# Patient Record
Sex: Male | Born: 1975 | Race: White | Hispanic: No | Marital: Married | Smoking: Current every day smoker
Health system: Southern US, Community
[De-identification: ages and names within clinical notes are randomized; demographics above are authoritative.]

## PROBLEM LIST (undated history)

## (undated) DIAGNOSIS — F909 Attention-deficit hyperactivity disorder, unspecified type: Secondary | ICD-10-CM

## (undated) DIAGNOSIS — G919 Hydrocephalus, unspecified: Secondary | ICD-10-CM

## (undated) HISTORY — PX: CSF SHUNT: SHX92

---

## 2017-10-30 ENCOUNTER — Emergency Department (INDEPENDENT_AMBULATORY_CARE_PROVIDER_SITE_OTHER): Payer: 59

## 2017-10-30 ENCOUNTER — Emergency Department (INDEPENDENT_AMBULATORY_CARE_PROVIDER_SITE_OTHER)
Admission: EM | Admit: 2017-10-30 | Discharge: 2017-10-30 | Disposition: A | Discharge status: Home / Self Care | Payer: Self-pay | Source: Home / Self Care | Attending: Family Medicine | Admitting: Family Medicine

## 2017-10-30 ENCOUNTER — Telehealth: Payer: Self-pay | Admitting: *Deleted

## 2017-10-30 ENCOUNTER — Encounter: Payer: Self-pay | Admitting: Emergency Medicine

## 2017-10-30 ENCOUNTER — Other Ambulatory Visit: Payer: Self-pay

## 2017-10-30 DIAGNOSIS — M542 Cervicalgia: Secondary | ICD-10-CM | POA: Diagnosis not present

## 2017-10-30 DIAGNOSIS — S161XXA Strain of muscle, fascia and tendon at neck level, initial encounter: Secondary | ICD-10-CM

## 2017-10-30 HISTORY — DX: Attention-deficit hyperactivity disorder, unspecified type: F90.9

## 2017-10-30 HISTORY — DX: Hydrocephalus, unspecified: G91.9

## 2017-10-30 NOTE — ED Triage Notes (Signed)
Patient reports soreness in back of neck that radiates to shoulder areas on both sides.

## 2017-10-30 NOTE — Discharge Instructions (Signed)
Apply ice pack for 20 to 30 minutes, 3 to 4 times daily  Continue until pain and swelling decrease.  May take Tylenol as needed for pain. °Begin neck range of motion and stretching exercises as tolerated. °
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
## 2017-10-30 NOTE — Telephone Encounter (Signed)
Spoke to pt's wife given xray results. He will come by the clinic to pick up neck spasm xray handout per dr Cathren Harshbeese.
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
## 2021-07-31 ENCOUNTER — Encounter (HOSPITAL_BASED_OUTPATIENT_CLINIC_OR_DEPARTMENT_OTHER): Payer: Self-pay | Admitting: *Deleted

## 2021-07-31 ENCOUNTER — Emergency Department (HOSPITAL_BASED_OUTPATIENT_CLINIC_OR_DEPARTMENT_OTHER): Payer: No Typology Code available for payment source

## 2021-07-31 ENCOUNTER — Other Ambulatory Visit: Payer: Self-pay

## 2021-07-31 ENCOUNTER — Observation Stay (HOSPITAL_BASED_OUTPATIENT_CLINIC_OR_DEPARTMENT_OTHER)
Admission: EM | Admit: 2021-07-31 | Discharge: 2021-08-02 | Disposition: A | Discharge status: Home / Self Care | Payer: No Typology Code available for payment source | Attending: General Surgery | Admitting: General Surgery

## 2021-07-31 DIAGNOSIS — R1011 Right upper quadrant pain: Secondary | ICD-10-CM | POA: Diagnosis present

## 2021-07-31 DIAGNOSIS — F17210 Nicotine dependence, cigarettes, uncomplicated: Secondary | ICD-10-CM | POA: Diagnosis not present

## 2021-07-31 DIAGNOSIS — K8010 Calculus of gallbladder with chronic cholecystitis without obstruction: Secondary | ICD-10-CM | POA: Diagnosis present

## 2021-07-31 DIAGNOSIS — K8012 Calculus of gallbladder with acute and chronic cholecystitis without obstruction: Principal | ICD-10-CM | POA: Insufficient documentation

## 2021-07-31 DIAGNOSIS — K8000 Calculus of gallbladder with acute cholecystitis without obstruction: Secondary | ICD-10-CM | POA: Diagnosis present

## 2021-07-31 DIAGNOSIS — K819 Cholecystitis, unspecified: Secondary | ICD-10-CM | POA: Diagnosis present

## 2021-07-31 LAB — COMPREHENSIVE METABOLIC PANEL
ALT: 19 U/L (ref 0–44)
AST: 19 U/L (ref 15–41)
Albumin: 3.9 g/dL (ref 3.5–5.0)
Alkaline Phosphatase: 102 U/L (ref 38–126)
Anion gap: 7 (ref 5–15)
BUN: 11 mg/dL (ref 6–20)
CO2: 27 mmol/L (ref 22–32)
Calcium: 9 mg/dL (ref 8.9–10.3)
Chloride: 104 mmol/L (ref 98–111)
Creatinine, Ser: 0.88 mg/dL (ref 0.61–1.24)
GFR, Estimated: >60 mL/min (ref >60)
Glucose, Bld: 100 mg/dL — ABNORMAL HIGH (ref 70–99)
Potassium: 3.8 mmol/L (ref 3.5–5.1)
Sodium: 138 mmol/L (ref 135–145)
Total Bilirubin: 0.4 mg/dL (ref 0.3–1.2)
Total Protein: 6.8 g/dL (ref 6.5–8.1)

## 2021-07-31 LAB — LIPASE, BLOOD: Lipase: 47 U/L (ref 11–51)

## 2021-07-31 LAB — URINALYSIS, ROUTINE W REFLEX MICROSCOPIC
Bilirubin Urine: NEGATIVE
Glucose, UA: NEGATIVE mg/dL
Hgb urine dipstick: NEGATIVE
Ketones, ur: NEGATIVE mg/dL
Leukocytes,Ua: NEGATIVE
Nitrite: NEGATIVE
Protein, ur: NEGATIVE mg/dL
Specific Gravity, Urine: 1.025 (ref 1.005–1.030)
pH: 7.5 (ref 5.0–8.0)

## 2021-07-31 LAB — CBC
HCT: 45.8 % (ref 39.0–52.0)
Hemoglobin: 15.5 g/dL (ref 13.0–17.0)
MCH: 32.3 pg (ref 26.0–34.0)
MCHC: 33.8 g/dL (ref 30.0–36.0)
MCV: 95.4 fL (ref 80.0–100.0)
Platelets: 188 10*3/uL (ref 150–400)
RBC: 4.8 MIL/uL (ref 4.22–5.81)
RDW: 13.4 % (ref 11.5–15.5)
WBC: 6.9 10*3/uL (ref 4.0–10.5)
nRBC: 0 % (ref 0.0–0.2)

## 2021-07-31 MED ORDER — IOHEXOL 300 MG/ML  SOLN
100.0000 mL | Freq: Once | INTRAMUSCULAR | Status: AC | PRN
  Administered 2021-07-31: 100 mL via INTRAVENOUS

## 2021-07-31 MED ORDER — HYDROMORPHONE HCL 1 MG/ML IJ SOLN
0.5000 mg | Freq: Once | INTRAMUSCULAR | Status: AC
  Administered 2021-07-31: 0.5 mg via INTRAVENOUS
  Filled 2021-07-31: qty 1

## 2021-07-31 MED ORDER — PIPERACILLIN-TAZOBACTAM 3.375 G IVPB 30 MIN
3.3750 g | Freq: Once | INTRAVENOUS | Status: AC
Start: 2021-08-01 — End: 2021-08-01
  Administered 2021-07-31: 3.375 g via INTRAVENOUS
  Filled 2021-07-31: qty 50

## 2021-07-31 MED ORDER — ONDANSETRON HCL 4 MG/2ML IJ SOLN
4.0000 mg | Freq: Once | INTRAMUSCULAR | Status: AC
  Administered 2021-07-31: 4 mg via INTRAVENOUS
  Filled 2021-07-31: qty 2

## 2021-07-31 MED ORDER — MORPHINE SULFATE (PF) 4 MG/ML IV SOLN
4.0000 mg | Freq: Once | INTRAVENOUS | Status: AC
  Administered 2021-07-31: 4 mg via INTRAVENOUS
  Filled 2021-07-31: qty 1

## 2021-07-31 NOTE — ED Provider Notes (Signed)
?[facility] EMERGENCY DEPARTMENT ?Provider Note ? ?CSN: [id number] ?Arrival date & time: 07/31/21 2039 ? ?Chief Complaint(s) ?Abdominal Pain ? ?HPI ?Mathew Olson is a 46 y.o. male with PMH hydrocephalus as a child status post VP shunt that has since been removed, ADHD on Adderall who presents emergency department for evaluation of right upper quadrant abdominal pain.  Patient states that pain has been slowly worsening over the last 3 days.  Initially postprandial but now persistent.  Denies nausea, vomiting, headache, fever or other systemic symptoms. ? ? ?Abdominal Pain ? ?Past Medical History ?Past Medical History:  ?Diagnosis Date  ? ADHD   ? Hydrocephalus (HCC)   ? ?There are no problems to display for this patient. ? ?Home Medication(s) ?Prior to Admission medications   ?Medication Sig Start Date End Date Taking? Authorizing Provider  ?amphetamine-dextroamphetamine (ADDERALL) 20 MG tablet Take 20 mg by mouth daily.    [provider]  ?                                                                                                                                  ?Past Surgical History ?Past Surgical History:  ?Procedure Laterality Date  ? CSF SHUNT    ? ?Family History ?No family history on file. ? ?Social History ?Social History  ? ?Tobacco Use  ? Smoking status: Every Day  ?  Types: Cigarettes  ? Smokeless tobacco: Never  ?Vaping Use  ? Vaping Use: Never used  ?Substance Use Topics  ? Alcohol use: Yes  ?  Comment: occasional  ? Drug use: Not Currently  ? ?Allergies ?Patient has no active allergies. ? ?Review of Systems ?Review of Systems  ?Gastrointestinal:  Positive for abdominal pain.  ? ?Physical Exam ?Vital Signs  ?I have reviewed the triage vital signs ?BP 128/85   Pulse 70   Temp 97.9 ?F (36.6 ?C)   Resp 19   Ht 6\' 3"  (1.905 m)   Wt 98.7 kg   SpO2 97%   BMI 27.20 kg/m?  ? ?Physical Exam ?Vitals and nursing note reviewed.  ?Constitutional:   ?   General: He is not in  acute distress. ?   Appearance: He is well-developed.  ?HENT:  ?   Head: Normocephalic and atraumatic.  ?Eyes:  ?   Conjunctiva/sclera: Conjunctivae normal.  ?Cardiovascular:  ?   Rate and Rhythm: Normal rate and regular rhythm.  ?   Heart sounds: No murmur heard. ?Pulmonary:  ?   Effort: Pulmonary effort is normal. No respiratory distress.  ?   Breath sounds: Normal breath sounds.  ?Abdominal:  ?   Palpations: Abdomen is soft.  ?   Tenderness: There is abdominal tenderness in the right upper quadrant.  ?Musculoskeletal:     ?   General: No swelling.  ?   Cervical back: Neck supple.  ?Skin: ?   General: Skin is warm and dry.  ?   Capillary Refill:  Capillary refill takes less than 2 seconds.  ?Neurological:  ?   Mental Status: He is alert.  ?Psychiatric:     ?   Mood and Affect: Mood normal.  ? ? ?ED Results and Treatments ?Labs ?(all labs ordered are listed, but only abnormal results are displayed) ?Labs Reviewed  ?COMPREHENSIVE METABOLIC PANEL - Abnormal; Notable for the following components:  ?    Result Value  ? Glucose, Bld 100 (*)   ? All other components within normal limits  ?URINALYSIS, ROUTINE W REFLEX MICROSCOPIC - Abnormal; Notable for the following components:  ? APPearance CLOUDY (*)   ? All other components within normal limits  ?LIPASE, BLOOD  ?CBC  ?                                                                                                                       ? ?Radiology ?No results found. ? ?Pertinent labs & imaging results that were available during my care of the patient were reviewed by me and considered in my medical decision making (see MDM for details). ? ?Medications Ordered in ED ?Medications  ?morphine (PF) 4 MG/ML injection 4 mg (4 mg Intravenous Given 07/31/21 2205)  ?ondansetron [facility](ZOFRAN) injection 4 mg (4 mg Intravenous Given 07/31/21 2205)  ?iohexol (OMNIPAQUE) 300 MG/ML solution 100 mL (100 mLs Intravenous Contrast Given 07/31/21 2238)  ?                                                                ?                                                                    ?Procedures ?Procedures ? ?(including critical care time) ? ?Medical Decision Making / ED Course ? ? ?This patient presents to the ED for concern of right upper quadrant abdominal pain, this involves an extensive number of treatment options, and is a complaint that carries with it a high risk of complications and morbidity.  The differential diagnosis includes cholecystitis, choledocholithiasis, biliary colic pancreatitis ? ?MDM: ?Patient seen in the emergency department for evaluation of abdominal pain.  Physical exam with significant tenderness with positive Murphy sign in the right upper quadrant.  Laboratory evaluation unremarkable.  CT abdomen pelvis concerning for chronic cholecystitis versus developing porcelain gallbladder.  Surgery [facility]  Zosyn started and patient admitted. ? ? ?Additional history obtained: ? ?-External records from outside source obtained and reviewed including: Chart review including previous notes, labs, imaging, consultation notes ? ? ?  Lab Tests: ?-I ordered, reviewed, and interpreted labs.   ?The pertinent results include:   ?Labs Reviewed  ?COMPREHENSIVE METABOLIC PANEL - Abnormal; Notable for the following components:  ?    Result Value  ? Glucose, Bld 100 (*)   ? All other components within normal limits  ?URINALYSIS, ROUTINE W REFLEX MICROSCOPIC - Abnormal; Notable for the following components:  ? APPearance CLOUDY (*)   ? All other components within normal limits  ?LIPASE, BLOOD  ?CBC  ?  ? ? ? ? ? ?Imaging Studies ordered: ?I ordered imaging studies including CTAP ?I independently visualized and interpreted imaging. ?I agree with the radiologist interpretation ? ? ?Medicines ordered and prescription drug management: ?Meds ordered this encounter  ?Medications  ? morphine (PF) 4 MG/ML injection 4 mg  ? ondansetron (ZOFRAN) injection 4 mg  ? iohexol  (OMNIPAQUE) 300 MG/ML solution 100 mL  ?  ?-I have reviewed the patients home medicines and have made adjustments as needed ? ?Critical interventions ?none ? ?Consultations Obtained: ?I requested consultation with the general surgery team,  and discussed lab and imaging findings as well as pertinent plan - they recommend: Admission to [facility] ? ? ?Cardiac Monitoring: ?The patient was maintained on a cardiac monitor.  I personally viewed and interpreted the cardiac monitored which showed an underlying rhythm of: NSR ? ?Social Determinants of Health:  ?Factors impacting patients care include: none ? ? ?Reevaluation: ?After the interventions noted above, I reevaluated the patient and found that they have :improved ? ?Co morbidities that complicate the patient evaluation ? ?Past Medical History:  ?Diagnosis Date  ? ADHD   ? Hydrocephalus (HCC)   ?  ? ? ?Dispostion: ?I considered admission for this patient, and due to his symptomatic chronic cholecystitis patient will be admitted ? ? ? ? ?Final Clinical Impression(s) / ED Diagnoses ?Final diagnoses:  ?None  ? ? ? ?@PCDICTATION @ ? ?  ? , MD ?07/31/21 2337 ? ?

## 2021-07-31 NOTE — ED Triage Notes (Signed)
Pt reports RUQ pain x 2 days. Denies n/v ?

## 2021-08-01 ENCOUNTER — Observation Stay (HOSPITAL_BASED_OUTPATIENT_CLINIC_OR_DEPARTMENT_OTHER): Payer: No Typology Code available for payment source | Admitting: Certified Registered Nurse Anesthetist

## 2021-08-01 ENCOUNTER — Encounter (HOSPITAL_COMMUNITY): Payer: Self-pay

## 2021-08-01 ENCOUNTER — Encounter (HOSPITAL_COMMUNITY): Admission: EM | Disposition: A | Discharge status: Home / Self Care | Payer: Self-pay | Source: Home / Self Care

## 2021-08-01 ENCOUNTER — Observation Stay (HOSPITAL_COMMUNITY): Payer: No Typology Code available for payment source | Admitting: Certified Registered Nurse Anesthetist

## 2021-08-01 DIAGNOSIS — K8010 Calculus of gallbladder with chronic cholecystitis without obstruction: Secondary | ICD-10-CM

## 2021-08-01 DIAGNOSIS — K8000 Calculus of gallbladder with acute cholecystitis without obstruction: Secondary | ICD-10-CM | POA: Diagnosis present

## 2021-08-01 HISTORY — PX: CHOLECYSTECTOMY: SHX55

## 2021-08-01 LAB — HIV ANTIBODY (ROUTINE TESTING W REFLEX): HIV Screen 4th Generation wRfx: NONREACTIVE

## 2021-08-01 SURGERY — LAPAROSCOPIC CHOLECYSTECTOMY
Anesthesia: General | Site: Abdomen

## 2021-08-01 MED ORDER — ONDANSETRON HCL 4 MG/2ML IJ SOLN
INTRAMUSCULAR | Status: DC | PRN
  Administered 2021-08-01: 4 mg via INTRAVENOUS

## 2021-08-01 MED ORDER — OXYCODONE HCL 5 MG PO TABS: 5.0000 mg | ORAL_TABLET | Freq: Once | ORAL | Status: DC | PRN

## 2021-08-01 MED ORDER — OXYCODONE HCL 5 MG/5ML PO SOLN: 5.0000 mg | Freq: Once | ORAL | Status: DC | PRN

## 2021-08-01 MED ORDER — HYDROMORPHONE HCL 1 MG/ML IJ SOLN: 1.0000 mg | INTRAMUSCULAR | Status: DC | PRN

## 2021-08-01 MED ORDER — ACETAMINOPHEN 10 MG/ML IV SOLN
1000.0000 mg | Freq: Once | INTRAVENOUS | Status: DC | PRN
  Administered 2021-08-01: 1000 mg via INTRAVENOUS

## 2021-08-01 MED ORDER — FENTANYL CITRATE PF 50 MCG/ML IJ SOSY
PREFILLED_SYRINGE | INTRAMUSCULAR | Status: AC
  Filled 2021-08-01: qty 3

## 2021-08-01 MED ORDER — FENTANYL CITRATE (PF) 250 MCG/5ML IJ SOLN
INTRAMUSCULAR | Status: DC | PRN
  Administered 2021-08-01 (×2): 100 ug via INTRAVENOUS
  Administered 2021-08-01: 50 ug via INTRAVENOUS

## 2021-08-01 MED ORDER — DEXAMETHASONE SODIUM PHOSPHATE 10 MG/ML IJ SOLN
INTRAMUSCULAR | Status: AC
  Filled 2021-08-01: qty 1

## 2021-08-01 MED ORDER — LIDOCAINE HCL (CARDIAC) PF 100 MG/5ML IV SOSY
PREFILLED_SYRINGE | INTRAVENOUS | Status: DC | PRN
  Administered 2021-08-01: 60 mg via INTRAVENOUS

## 2021-08-01 MED ORDER — TRAMADOL HCL 50 MG PO TABS: 50.0000 mg | ORAL_TABLET | Freq: Four times a day (QID) | ORAL | Status: DC | PRN

## 2021-08-01 MED ORDER — KETOROLAC TROMETHAMINE 30 MG/ML IJ SOLN
INTRAMUSCULAR | Status: AC
  Filled 2021-08-01: qty 1

## 2021-08-01 MED ORDER — ONDANSETRON HCL 4 MG/2ML IJ SOLN
INTRAMUSCULAR | Status: AC
  Filled 2021-08-01: qty 2

## 2021-08-01 MED ORDER — ROCURONIUM BROMIDE 10 MG/ML (PF) SYRINGE
PREFILLED_SYRINGE | INTRAVENOUS | Status: DC | PRN
  Administered 2021-08-01: 60 mg via INTRAVENOUS

## 2021-08-01 MED ORDER — ONDANSETRON HCL 4 MG/2ML IJ SOLN: 4.0000 mg | Freq: Four times a day (QID) | INTRAMUSCULAR | Status: DC | PRN

## 2021-08-01 MED ORDER — LIDOCAINE HCL (PF) 2 % IJ SOLN
INTRAMUSCULAR | Status: AC
  Filled 2021-08-01: qty 5

## 2021-08-01 MED ORDER — ACETAMINOPHEN 10 MG/ML IV SOLN
INTRAVENOUS | Status: AC
  Filled 2021-08-01: qty 100

## 2021-08-01 MED ORDER — CHLORHEXIDINE GLUCONATE CLOTH 2 % EX PADS: 6.0000 | MEDICATED_PAD | Freq: Once | CUTANEOUS | Status: DC

## 2021-08-01 MED ORDER — LACTATED RINGERS IR SOLN
Status: DC | PRN
  Administered 2021-08-01: 1000 mL

## 2021-08-01 MED ORDER — CEFAZOLIN SODIUM-DEXTROSE 2-4 GM/100ML-% IV SOLN
INTRAVENOUS | Status: AC
  Filled 2021-08-01: qty 100

## 2021-08-01 MED ORDER — HYDROMORPHONE HCL 1 MG/ML IJ SOLN
1.0000 mg | INTRAMUSCULAR | Status: DC | PRN
  Administered 2021-08-01 (×2): 1 mg via INTRAVENOUS
  Filled 2021-08-01 (×2): qty 1

## 2021-08-01 MED ORDER — ONDANSETRON 4 MG PO TBDP: 4.0000 mg | ORAL_TABLET | Freq: Four times a day (QID) | ORAL | Status: DC | PRN

## 2021-08-01 MED ORDER — ROCURONIUM BROMIDE 10 MG/ML (PF) SYRINGE
PREFILLED_SYRINGE | INTRAVENOUS | Status: AC
  Filled 2021-08-01: qty 10

## 2021-08-01 MED ORDER — DEXAMETHASONE SODIUM PHOSPHATE 10 MG/ML IJ SOLN
INTRAMUSCULAR | Status: DC | PRN
  Administered 2021-08-01: 10 mg via INTRAVENOUS

## 2021-08-01 MED ORDER — MIDAZOLAM HCL 2 MG/2ML IJ SOLN
INTRAMUSCULAR | Status: DC | PRN
  Administered 2021-08-01: 2 mg via INTRAVENOUS

## 2021-08-01 MED ORDER — FENTANYL CITRATE (PF) 250 MCG/5ML IJ SOLN
INTRAMUSCULAR | Status: AC
Start: 2021-08-01 — End: ?
  Filled 2021-08-01: qty 5

## 2021-08-01 MED ORDER — MIDAZOLAM HCL 2 MG/2ML IJ SOLN
INTRAMUSCULAR | Status: AC
  Filled 2021-08-01: qty 2

## 2021-08-01 MED ORDER — LACTATED RINGERS IV SOLN: INTRAVENOUS | Status: DC | PRN

## 2021-08-01 MED ORDER — ACETAMINOPHEN 650 MG RE SUPP: 650.0000 mg | Freq: Four times a day (QID) | RECTAL | Status: DC | PRN

## 2021-08-01 MED ORDER — ACETAMINOPHEN 325 MG PO TABS
650.0000 mg | ORAL_TABLET | Freq: Four times a day (QID) | ORAL | Status: DC | PRN
Start: 2021-08-01 — End: 2021-08-02

## 2021-08-01 MED ORDER — BUPIVACAINE-EPINEPHRINE 0.5% -1:200000 IJ SOLN
INTRAMUSCULAR | Status: DC | PRN
  Administered 2021-08-01: 30 mL

## 2021-08-01 MED ORDER — 0.9 % SODIUM CHLORIDE (POUR BTL) OPTIME
TOPICAL | Status: DC | PRN
  Administered 2021-08-01: 1000 mL

## 2021-08-01 MED ORDER — SODIUM CHLORIDE 0.45 % IV SOLN: INTRAVENOUS | Status: DC

## 2021-08-01 MED ORDER — PROPOFOL 10 MG/ML IV BOLUS
INTRAVENOUS | Status: DC | PRN
  Administered 2021-08-01: 200 mg via INTRAVENOUS

## 2021-08-01 MED ORDER — SUGAMMADEX SODIUM 200 MG/2ML IV SOLN
INTRAVENOUS | Status: DC | PRN
  Administered 2021-08-01: 200 mg via INTRAVENOUS

## 2021-08-01 MED ORDER — CHLORHEXIDINE GLUCONATE CLOTH 2 % EX PADS
6.0000 | MEDICATED_PAD | Freq: Once | CUTANEOUS | Status: DC
  Administered 2021-08-01: 6 via TOPICAL

## 2021-08-01 MED ORDER — AMISULPRIDE (ANTIEMETIC) 5 MG/2ML IV SOLN: 10.0000 mg | Freq: Once | INTRAVENOUS | Status: DC | PRN

## 2021-08-01 MED ORDER — KCL IN DEXTROSE-NACL 20-5-0.45 MEQ/L-%-% IV SOLN
INTRAVENOUS | Status: DC
  Filled 2021-08-01: qty 1000

## 2021-08-01 MED ORDER — CEFAZOLIN SODIUM-DEXTROSE 2-4 GM/100ML-% IV SOLN
2.0000 g | INTRAVENOUS | Status: AC
  Administered 2021-08-01: 2 g via INTRAVENOUS

## 2021-08-01 MED ORDER — PROPOFOL 10 MG/ML IV BOLUS
INTRAVENOUS | Status: AC
  Filled 2021-08-01: qty 20

## 2021-08-01 MED ORDER — KETOROLAC TROMETHAMINE 30 MG/ML IJ SOLN
30.0000 mg | Freq: Once | INTRAMUSCULAR | Status: AC
  Administered 2021-08-01: 30 mg via INTRAVENOUS

## 2021-08-01 MED ORDER — OXYCODONE HCL 5 MG PO TABS: 5.0000 mg | ORAL_TABLET | ORAL | Status: DC | PRN

## 2021-08-01 MED ORDER — BUPIVACAINE-EPINEPHRINE (PF) 0.25% -1:200000 IJ SOLN
INTRAMUSCULAR | Status: AC
  Filled 2021-08-01: qty 30

## 2021-08-01 MED ORDER — ACETAMINOPHEN 325 MG PO TABS: 650.0000 mg | ORAL_TABLET | Freq: Four times a day (QID) | ORAL | Status: DC | PRN

## 2021-08-01 MED ORDER — FENTANYL CITRATE PF 50 MCG/ML IJ SOSY
25.0000 ug | PREFILLED_SYRINGE | INTRAMUSCULAR | Status: DC | PRN
  Administered 2021-08-01 (×2): 50 ug via INTRAVENOUS

## 2021-08-01 SURGICAL SUPPLY — 38 items
APPLIER CLIP ROT 10 11.4 M/L (STAPLE) ×2
BAG COUNTER SPONGE SURGICOUNT (BAG) IMPLANT
CABLE HIGH FREQUENCY MONO STRZ (ELECTRODE) ×2 IMPLANT
CHLORAPREP W/TINT 26 (MISCELLANEOUS) ×2 IMPLANT
CLIP APPLIE ROT 10 11.4 M/L (STAPLE) ×1 IMPLANT
COVER MAYO STAND STRL (DRAPES) IMPLANT
COVER SURGICAL LIGHT HANDLE (MISCELLANEOUS) ×2 IMPLANT
DERMABOND ADVANCED (GAUZE/BANDAGES/DRESSINGS) ×1
DERMABOND ADVANCED .7 DNX12 (GAUZE/BANDAGES/DRESSINGS) IMPLANT
DRAPE C-ARM 42X120 X-RAY (DRAPES) IMPLANT
ELECT REM PT RETURN 15FT ADLT (MISCELLANEOUS) ×2 IMPLANT
GLOVE SURG ORTHO LTX SZ8 (GLOVE) ×2 IMPLANT
GLOVE SURG SYN 7.5  E (GLOVE) ×1
GLOVE SURG SYN 7.5 E (GLOVE) ×1 IMPLANT
GLOVE SURG SYN 7.5 PF PI (GLOVE) ×1 IMPLANT
GOWN STRL REUS W/ TWL XL LVL3 (GOWN DISPOSABLE) ×2 IMPLANT
GOWN STRL REUS W/TWL XL LVL3 (GOWN DISPOSABLE) ×2
HEMOSTAT SURGICEL 4X8 (HEMOSTASIS) IMPLANT
IRRIG SUCT STRYKERFLOW 2 WTIP (MISCELLANEOUS) ×2
IRRIGATION SUCT STRKRFLW 2 WTP (MISCELLANEOUS) ×1 IMPLANT
KIT BASIN OR (CUSTOM PROCEDURE TRAY) ×2 IMPLANT
KIT TURNOVER KIT A (KITS) IMPLANT
PAD POSITIONING PINK XL (MISCELLANEOUS) IMPLANT
PENCIL SMOKE EVACUATOR (MISCELLANEOUS) IMPLANT
POUCH SPECIMEN RETRIEVAL 10MM (ENDOMECHANICALS) ×2 IMPLANT
SCISSORS LAP 5X35 DISP (ENDOMECHANICALS) ×2 IMPLANT
SET CHOLANGIOGRAPH MIX (MISCELLANEOUS) ×2 IMPLANT
SET TUBE SMOKE EVAC HIGH FLOW (TUBING) ×2 IMPLANT
SLEEVE XCEL OPT CAN 5 100 (ENDOMECHANICALS) ×2 IMPLANT
SPIKE FLUID TRANSFER (MISCELLANEOUS) ×2 IMPLANT
STRIP CLOSURE SKIN 1/2X4 (GAUZE/BANDAGES/DRESSINGS) ×2 IMPLANT
SUT VIC AB 4-0 PS2 27 (SUTURE) ×2 IMPLANT
TAPE CLOTH 4X10 WHT NS (GAUZE/BANDAGES/DRESSINGS) IMPLANT
TOWEL OR 17X26 10 PK STRL BLUE (TOWEL DISPOSABLE) ×2 IMPLANT
TRAY LAPAROSCOPIC (CUSTOM PROCEDURE TRAY) ×2 IMPLANT
TROCAR BLADELESS OPT 5 100 (ENDOMECHANICALS) ×2 IMPLANT
TROCAR XCEL BLUNT TIP 100MML (ENDOMECHANICALS) ×2 IMPLANT
TROCAR XCEL NON-BLD 11X100MML (ENDOMECHANICALS) ×2 IMPLANT

## 2021-08-01 NOTE — Op Note (Signed)
Procedure Note ? ?Pre-operative Diagnosis:  cholecystitis, cholelithiasis ? ?Post-operative Diagnosis:  same ? ?Surgeon:  Darnell Level, MD ? ?Assistant:  none  ? ?Procedure:  Laparoscopic cholecystectomy ? ?Anesthesia:  General ? ?Estimated Blood Loss:  minimal ? ?Drains: none ?        ?Specimen: gallbladder to pathology ? ?Indications:  Patient is a 46 year old male who presented to the emergency department at [facility] with 3-day history of abdominal pain localized to the right upper quadrant and epigastrium.  This was associated with nausea but no emesis.  Laboratory studies were normal.  Patient underwent CT scan of the abdomen and pelvis.  This demonstrated cholelithiasis with diffuse gallbladder wall thickening and scattered mural calcification.  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  Patient was transferred to [facility] for surgical management. ? ?Procedure description: The patient was seen in the pre-op holding area. The risks, benefits, complications, treatment options, and expected outcomes were previously discussed with the patient. The patient agreed with the proposed plan and has signed the informed consent form. ? ?The patient was transported to operating room #1 at the [facility]. The patient was placed in the supine position on the operating room table. Following induction of general anesthesia, the abdomen was prepped and draped in the usual aseptic fashion. ? ?An incision was made in the skin near the umbilicus. The midline fascia was incised and the peritoneal cavity was entered and a Hasson cannula was introduced under direct vision. The cannula was secured with a 0-Vicryl pursestring suture. Pneumoperitoneum was established with carbon dioxide. Additional cannulae were introduced under direct vision along the right costal margin in the midline, mid-clavicular line, and anterior axillary line. ?  ?The gallbladder was identified and the fundus  grasped and retracted cephalad. Adhesions were taken down bluntly and the electrocautery was utilized as needed, taking care not to involve any adjacent structures. The infundibulum was grasped and retracted laterally, exposing the peritoneum overlying the triangle of Calot. The common bile duct was visualized and appeared normal.  The peritoneum in the triangle was incised and structures exposed with blunt dissection. The cystic duct was clearly identified, bluntly dissected circumferentially, and clipped at the neck of the gallbladder.  The cystic duct was then ligated distally with ligaclips and divided. The cystic artery was identified, dissected circumferentially, ligated with ligaclips, and divided. ? ?The gallbladder was dissected away from the gallbladder bed using the electrocautery for hemostasis. The gallbladder was completely removed from the liver and placed into an endocatch bag. The gallbladder was removed in the endocatch bag through the umbilical port site and submitted to pathology for review. ? ?The right upper quadrant was irrigated and the gallbladder bed was inspected. Hemostasis was achieved with the electrocautery. ? ?Cannulae were removed under direct vision and good hemostasis was noted. Pneumoperitoneum was released and the majority of the carbon dioxide evacuated. The umbilical wound was irrigated and the fascia was then closed with the pursestring suture.  Local anesthetic was infiltrated at all port sites. Skin incisions were closed with 4-0 Monocril subcuticular sutures and Dermabond was applied. ? ?Instrument, sponge, and needle counts were correct at the conclusion of the case.  The patient was awakened from anesthesia and brought to the recovery room in stable condition.  The patient tolerated the procedure well. ? ? ?Darnell Level, MD ?[facility] ?Office: [phone] ?  ?

## 2021-08-01 NOTE — Anesthesia Procedure Notes (Signed)
Procedure Name: Intubation ?Date/Time: 08/01/2021 9:05 AM ?Performed by: Raenette Rover, CRNA ?Pre-anesthesia Checklist: Patient identified, Emergency Drugs available, Suction available and Patient being monitored ?Patient Re-evaluated:Patient Re-evaluated prior to induction ?Oxygen Delivery Method: Circle system utilized ?Preoxygenation: Pre-oxygenation with 100% oxygen ?Induction Type: IV induction ?Ventilation: Mask ventilation without difficulty ?Laryngoscope Size: Mac and 4 ?Grade View: Grade II ?Tube type: Oral ?Tube size: 7.5 mm ?Number of attempts: 1 ?Airway Equipment and Method: Stylet ?Placement Confirmation: ETT inserted through vocal cords under direct vision, positive ETCO2 and breath sounds checked- equal and bilateral ?Secured at: 24 cm ?Tube secured with: Tape ?Dental Injury: Teeth and Oropharynx as per pre-operative assessment  ? ? ? ? ?

## 2021-08-01 NOTE — ED Notes (Signed)
Patient is waiting for his wife to arrive, to transport him to [facility] ED.  ?

## 2021-08-01 NOTE — Anesthesia Preprocedure Evaluation (Addendum)
Anesthesia Evaluation  ?Patient identified by MRN, date of birth, ID band ?Patient awake ? ? ? ?Reviewed: ?Allergy & Precautions, NPO status , Patient's Chart, lab work & pertinent test results ? ?Airway ?Mallampati: II ? ?TM Distance: >3 FB ?Neck ROM: Full ? ? ? Dental ? ?(+) Poor Dentition ?  ?Pulmonary ?Current Smoker and Patient abstained from smoking.,  ?  ?Pulmonary exam normal ? ? ? ? ? ? ? Cardiovascular ?negative cardio ROS ?Normal cardiovascular exam ? ? ?  ?Neuro/Psych ?PSYCHIATRIC DISORDERS negative neurological ROS ?   ? GI/Hepatic ?negative GI ROS, Neg liver ROS,   ?Endo/Other  ?negative endocrine ROS ? Renal/GU ?negative Renal ROS  ? ?  ?Musculoskeletal ?negative musculoskeletal ROS ?(+)  ? Abdominal ?  ?Peds ? ?(+) ADHD Hematology ?negative hematology ROS ?(+)   ?Anesthesia Other Findings ?ACUTE CHOLECYSTITIS ?CHOLELITHIASIS ? Reproductive/Obstetrics ? ?  ? ? ? ? ? ? ? ? ? ? ? ? ? ?  ?  ? ? ? ? ? ? ? ?Anesthesia Physical ?Anesthesia Plan ? ?ASA: 2 ? ?Anesthesia Plan: General  ? ?Post-op Pain Management:   ? ?Induction: Intravenous ? ?PONV Risk Score and Plan: 2 and Ondansetron, Dexamethasone, Midazolam and Treatment may vary due to age or medical condition ? ?Airway Management Planned: Oral ETT ? ?Additional Equipment:  ? ?Intra-op Plan:  ? ?Post-operative Plan: Extubation in OR ? ?Informed Consent: I have reviewed the patients History and Physical, chart, labs and discussed the procedure including the risks, benefits and alternatives for the proposed anesthesia with the patient or authorized representative who has indicated his/her understanding and acceptance.  ? ? ? ?Dental advisory given ? ?Plan Discussed with: CRNA ? ?Anesthesia Plan Comments:   ? ? ? ? ? ?Anesthesia Quick Evaluation ? ?

## 2021-08-01 NOTE — Progress Notes (Signed)
MD Gerkin paged for patient's arrival. ?

## 2021-08-01 NOTE — ED Notes (Signed)
Wife is here to pick up the patient. She was instructed to take him to [facility]. Message sent to nurse to advise of transportation. Nurse will meet the patient in the ED.  ?

## 2021-08-01 NOTE — H&P (Signed)
? ? ? ?Mathew NorfolkChristopher Olson is an 46 y.o. male.   ? ?Chief Complaint:  abdominal pain, acute cholecystitis, cholelithiasis ? ?HPI: Patient is a 46 year old male who presented to the emergency department at [facility] with 3-day history of abdominal pain localized to the right upper quadrant and epigastrium.  This was associated with nausea but no emesis.  Laboratory studies were normal.  Patient underwent CT scan of the abdomen and pelvis.  This demonstrated cholelithiasis with diffuse gallbladder wall thickening and scattered mural calcification.  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  [facility] management. ? ?Patient has a history of ventriculoperitoneal shunt as an infant.  This was apparently in place for hydrocephalus for only a few months.  Patient has had no other abdominal surgery. ? ?Patient denies any history of hepatobiliary or pancreatic disease.  He denies jaundice or acholic stools.  There is no history of hepatitis or pancreatitis. ? ?Patient is employed as a [employer] architect[employer]ruck driver. ? ?Past Medical History:  ?Diagnosis Date  ? ADHD   ? Hydrocephalus (HCC)   ? ? ?Past Surgical History:  ?Procedure Laterality Date  ? CSF SHUNT    ? ? ?History reviewed. No pertinent family history. ?Social History:  reports that he has been smoking cigarettes. He has never used smokeless tobacco. He reports current alcohol use. He reports that he does not currently use drugs. ? ?Allergies: No Known Allergies ? ?Medications Prior to Admission  ?Medication Sig Dispense Refill  ? amphetamine-dextroamphetamine (ADDERALL) 20 MG tablet Take 20 mg by mouth daily.    ? ? ?Results for orders placed or performed during the hospital encounter of 07/31/21 (from the past 48 hour(s))  ?Urinalysis, Routine w reflex microscopic Urine, Clean Catch     Status: Abnormal  ? Collection Time: 07/31/21  9:03 PM  ?Result Value Ref Range  ? Color, Urine YELLOW YELLOW  ?  APPearance CLOUDY (A) CLEAR  ? Specific Gravity, Urine 1.025 1.005 - 1.030  ? pH 7.5 5.0 - 8.0  ? Glucose, UA NEGATIVE NEGATIVE mg/dL  ? Hgb urine dipstick NEGATIVE NEGATIVE  ? Bilirubin Urine NEGATIVE NEGATIVE  ? Ketones, ur NEGATIVE NEGATIVE mg/dL  ? Protein, ur NEGATIVE NEGATIVE mg/dL  ? Nitrite NEGATIVE NEGATIVE  ? Leukocytes,Ua NEGATIVE NEGATIVE  ?  Comment: Microscopic not done on urines with negative protein, blood, leukocytes, nitrite, or glucose < 500 mg/dL. ?Performed at [facility] [place], [address] [place], [place] [address] ?  ?Lipase, blood     Status: None  ? Collection Time: 07/31/21  9:31 PM  ?Result Value Ref Range  ? Lipase 47 11 - 51 U/L  ?  Comment: Performed at [facility] [place], [address] [place], [place] [address]  ?Comprehensive metabolic panel     Status: Abnormal  ? Collection Time: 07/31/21  9:31 PM  ?Result Value Ref Range  ? Sodium 138 135 - 145 mmol/L  ? Potassium 3.8 3.5 - 5.1 mmol/L  ? Chloride 104 98 - 111 mmol/L  ? CO2 27 22 - 32 mmol/L  ? Glucose, Bld 100 (H) 70 - 99 mg/dL  ?  Comment: Glucose reference range applies only to samples taken after fasting for at least 8 hours.  ? BUN 11 6 - 20 mg/dL  ? Creatinine, Ser 0.88 0.61 - 1.24 mg/dL  ? Calcium 9.0 8.9 - 10.3 mg/dL  ? Total Protein 6.8 6.5 - 8.1 g/dL  ?  Albumin 3.9 3.5 - 5.0 g/dL  ? AST 19 15 - 41 U/L  ? ALT 19 0 - 44 U/L  ? Alkaline Phosphatase 102 38 - 126 U/L  ? Total Bilirubin 0.4 0.3 - 1.2 mg/dL  ? GFR, Estimated >60 >60 mL/min  ?  Comment: (NOTE) ?Calculated using the CKD-EPI Creatinine Equation (2021) ?  ? Anion gap 7 5 - 15  ?  Comment: Performed at [facility], [address], [place], [place] [address]  ?CBC     Status: None  ? Collection Time: 07/31/21  9:31 PM  ?Result Value Ref Range  ? WBC 6.9 4.0 - 10.5 K/uL  ? RBC 4.80 4.22 - 5.81 MIL/uL  ? Hemoglobin 15.5 13.0 - 17.0 g/dL  ? HCT 45.8 39.0 - 52.0 %  ? MCV 95.4 80.0 - 100.0 fL  ? MCH 32.3 26.0 - 34.0 pg  ? MCHC 33.8 30.0 - 36.0  g/dL  ? RDW 13.4 11.5 - 15.5 %  ? Platelets 188 150 - 400 K/uL  ? nRBC 0.0 0.0 - 0.2 %  ?  Comment: Performed at [facility], [address] [place], [place] [address]  ? ?CT ABDOMEN PELVIS W CONTRAST ? ?Result Date: 07/31/2021 ?CLINICAL DATA:  Epigastric and right upper quadrant abdominal pain EXAM: CT ABDOMEN AND PELVIS WITH CONTRAST TECHNIQUE: Multidetector CT imaging of the abdomen and pelvis was performed using the standard protocol following bolus administration of intravenous contrast. RADIATION DOSE REDUCTION: This exam was performed according to the departmental dose-optimization program which includes automated exposure control, adjustment of the mA and/or kV according to patient size and/or use of iterative reconstruction technique. CONTRAST:  OMNIPAQUE IOHEXOL 300 MG/ML  SOLN COMPARISON:  09/26/2016 FINDINGS: Lower chest: No acute abnormality. Hepatobiliary: Cholelithiasis noted with layering small gallstones within the gallbladder fundus identified. The gallbladder is not distended, there is, however, irregular, diffuse gallbladder wall thickening with scattered mural calcification. This may reflect changes of chronic cholecystitis and appears similar to prior examination. 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 A discrete mass lesion, however, is not clearly identified. No enhancing intrahepatic mass. No intra or extrahepatic biliary ductal dilation. Pancreas: Unremarkable Spleen: Unremarkable Adrenals/Urinary Tract: Adrenal glands are unremarkable. Kidneys are normal, without renal calculi, focal lesion, or hydronephrosis. Bladder is unremarkable. Stomach/Bowel: Stomach is within normal limits. Appendix appears normal. No evidence of bowel wall thickening, distention, or inflammatory changes. No free intraperitoneal gas or fluid. Vascular/Lymphatic: Mild aortoiliac atherosclerotic calcification. No aortic aneurysm. No pathologic adenopathy  within the abdomen and pelvis. Reproductive: Prostate is unremarkable. Other: No abdominal wall hernia.  Rectum unremarkable. Musculoskeletal: No acute bone abnormality. No lytic or blastic bone lesion. IMPRESSION: Cholelithiasis. Diffuse gallbladder wall thickening with scattered mural calcification. This appears similar to prior examination may reflect changes of chronic cholecystitis. 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 Surgical consultation may be helpful for further management. Aortic Atherosclerosis (ICD10-I70.0). Electronically Signed   By: Helyn Numbers M.D.   On: 07/31/2021 23:28   ? ?Review of Systems  ?Constitutional:  Positive for appetite change.  ?HENT: Negative.    ?Eyes: Negative.   ?Respiratory: Negative.    ?Cardiovascular: Negative.   ?Gastrointestinal:  Positive for abdominal pain and nausea. Negative for vomiting.  ?Endocrine: Negative.   ?Genitourinary: Negative.   ?Musculoskeletal: Negative.   ?Skin: Negative.   ?Allergic/Immunologic: Negative.   ?Neurological: Negative.   ?Hematological: Negative.   ?Psychiatric/Behavioral:  The patient is hyperactive.   ? ? ?  Physical Exam  ? ?Blood pressure (!) 115/4, pulse 61, temperature 98 ?F (36.7 ?C), temperature source Oral, resp. rate 14, height 6\' 3"  (1.905 m), weight 97.5 kg, SpO2 98 %. ? ?CONSTITUTIONAL: ?no acute distress; conversant; no obvious deformities ? ?EYES: ?Conjunctiva clear and moist; pupils equal bilaterally ? ?NECK: ?trachea midline; no thyroid nodularity ? ?LUNGS: ?respiratory effort normal & unlabored; no wheeze; no rales ? ?CV: ?rate and rhythm regular; no significant murmur; no edema bilat lower extremities ? ?GI: ?abdomen is soft without distention; there is a well-healed surgical scar in the right upper quadrant of the abdomen; palpation reveals mild to moderate tenderness to palpation in the epigastrium and right upper quadrant without palpable mass ? ?MSK: ?normal range of motion of  extremities; no clubbing; no cyanosis ? ?PSYCH: ?appropriate affect for situation; alert and oriented to person, place, & time ? ?LYMPHATIC: ?no palpable cervical lymphadenopathy ? ?  ? ?Assessment/Plan ? ?Acute chol

## 2021-08-01 NOTE — Anesthesia Postprocedure Evaluation (Signed)
Anesthesia Post Note ? ?Patient: Mathew Olson ? ?Procedure(s) Performed: LAPAROSCOPIC CHOLECYSTECTOMY (Abdomen) ? ?  ? ?Patient location during evaluation: PACU ?Anesthesia Type: General ?Level of consciousness: awake ?Pain management: pain level controlled ?Vital Signs Assessment: post-procedure vital signs reviewed and stable ?Respiratory status: spontaneous breathing, nonlabored ventilation, respiratory function stable and patient connected to nasal cannula oxygen ?Cardiovascular status: blood pressure returned to baseline and stable ?Postop Assessment: no apparent nausea or vomiting ?Anesthetic complications: no ? ? ?No notable events documented. ? ?Last Vitals:  ?Vitals:  ? 08/01/21 1048 08/01/21 1126  ?BP:  127/85  ?Pulse: (!) 59 73  ?Resp: 10 16  ?Temp:  36.8 ?C  ?SpO2: 95% 97%  ?  ?Last Pain:  ?Vitals:  ? 08/01/21 1126  ?TempSrc: Oral  ?PainSc: 8   ? ? ?  ?  ?  ?  ?  ?  ? ?Sol Odor P Ikesha Siller ? ? ? ? ?

## 2021-08-01 NOTE — Transfer of Care (Signed)
Immediate Anesthesia Transfer of Care Note ? ?Patient: Mathew Olson ? ?Procedure(s) Performed: LAPAROSCOPIC CHOLECYSTECTOMY (Abdomen) ? ?Patient Location: PACU ? ?Anesthesia Type:General ? ?Level of Consciousness: awake, alert , oriented and patient cooperative ? ?Airway & Oxygen Therapy: Patient Spontanous Breathing ? ?Post-op Assessment: Report given to RN and Post -op Vital signs reviewed and stable ? ?Post vital signs: Reviewed and stable ? ?Last Vitals:  ?Vitals Value Taken Time  ?BP 170/99 08/01/21 1012  ?Temp    ?Pulse 84 08/01/21 1015  ?Resp 15 08/01/21 1015  ?SpO2 96 % 08/01/21 1015  ?Vitals shown include unvalidated device data. ? ?Last Pain:  ?Vitals:  ? 08/01/21 0732  ?TempSrc:   ?PainSc: 4   ?   ? ?Patients Stated Pain Goal: 1 (08/01/21 0732) ? ?Complications: No notable events documented. ?

## 2021-08-01 NOTE — ED Notes (Signed)
Patient walked to POV.  ?

## 2021-08-02 ENCOUNTER — Encounter (HOSPITAL_COMMUNITY): Payer: Self-pay | Admitting: Surgery

## 2021-08-02 MED ORDER — TRAMADOL HCL 50 MG PO TABS: 50.0000 mg | ORAL_TABLET | Freq: Four times a day (QID) | ORAL | 0 refills | Status: AC | PRN

## 2021-08-02 NOTE — Discharge Summary (Signed)
Physician Discharge Summary  ?Patient ID: ?Mathew Olson ?MRN: [id number] ?DOB/AGE: 12/06/1975 46 y.o. ? ?Admit date: 07/31/2021 ?Discharge date: 08/02/2021 ? ?Admission Diagnoses: ?Cholecystitis  ?Discharge Diagnoses:  ?Principal Problem: ?  Cholecystitis, acute with cholelithiasis ?Active Problems: ?  Cholecystitis ? ? ?Discharged Condition: good ? ?Hospital Course: Patient was admitted to the med surg floor.  Lap cholecystectomy was performed the following day.  Diet was advanced as tolerated after surgery.  By postop day 1, he was tolerating a solid diet and pain was controlled with oral medications.  he was urinating without difficulty and ambulating without assistance.  Patient was felt to be in stable condition for discharge to home.  ? ?Consults: None ? ?Significant Diagnostic Studies: labs: cbc, cmet ? ?Treatments: IV hydration, analgesia: acetaminophen, and surgery: lap chole ? ?Discharge Exam: ?Blood pressure (!) 131/91, pulse 73, temperature 98 ?F (36.7 ?C), temperature source Oral, resp. rate 18, height 6\' 3"  (1.905 m), weight 97.5 kg, SpO2 95 %. ?General appearance: alert and cooperative ?GI: soft, appropriately tender, non-distended ?Incision/Wound: clean, dry, intact ? ?Disposition: Discharge disposition: 01-Home or Self Care ? ? ? ? ? ? ? ?Allergies as of 08/02/2021   ?No Known Allergies ?  ? ?  ?Medication List  ?  ? ?TAKE these medications   ? ?traMADol 50 MG tablet ?Commonly known as: ULTRAM ?Take 1 tablet (50 mg total) by mouth every 6 (six) hours as needed (mild pain). ?  ? ?  ? ? Follow-up Information   ? ? [facility], [facility]. Schedule an appointment as soon as possible for a visit in 2 week(s).   ?Specialty: General Surgery ?Contact information: ?[address] ?[address] ?[place] ?[phone] ? ?  ?  ? ?  ?  ? ?  ? ? ?Signed: ?[phone] ?08/02/2021, 8:53 AM ? ? ?

## 2021-08-02 NOTE — Discharge Instructions (Signed)
LAPAROSCOPIC SURGERY: POST OP INSTRUCTIONS  DIET: Follow a light bland diet the first 24 hours after arrival home, such as soup, liquids, crackers, etc.  Be sure to include lots of fluids daily.  Avoid fast food or heavy meals as your are more likely to get nauseated.  Eat a low fat the next few days after surgery.   Take your usually prescribed home medications unless otherwise directed. PAIN CONTROL: Pain is best controlled by a usual combination of three different methods TOGETHER: Ice/Heat Over the counter pain medication Prescription pain medication Most patients will experience some swelling and bruising around the incisions.  Ice packs or heating pads (30-60 minutes up to 6 times a day) will help. Use ice for the first few days to help decrease swelling and bruising, then switch to heat to help relax tight/sore spots and speed recovery.  Some people prefer to use ice alone, heat alone, alternating between ice & heat.  Experiment to what works for you.  Swelling and bruising can take several weeks to resolve.   It is helpful to take an over-the-counter pain medication regularly for the first few weeks.  Choose one of the following that works best for you: Naproxen (Aleve, etc)  Two 220mg tabs twice a day Ibuprofen (Advil, etc) Three 200mg tabs four times a day (every meal & bedtime) A  prescription for pain medication (such as percocet, vicodin, oxycodone, hydrocodone, etc) should be given to you upon discharge.  Take your pain medication as prescribed.  If you are having problems/concerns with the prescription medicine (does not control pain, nausea, vomiting, rash, itching, etc), please call us [phone] to see if we need to switch you to a different pain medicine that will work better for you and/or control your side effect better. If you need a refill on your pain medication, please contact your pharmacy.  They will contact our office to request authorization. Prescriptions will not be  filled after 5 pm or on week-ends.   Avoid getting constipated.  Between the surgery and the pain medications, it is common to experience some constipation.  Increasing fluid intake and taking a fiber supplement (such as Metamucil, Citrucel, FiberCon, MiraLax, etc) 1-2 times a day regularly will usually help prevent this problem from occurring.  A mild laxative (prune juice, Milk of Magnesia, MiraLax, etc) should be taken according to package directions if there are no bowel movements after 48 hours.   Watch out for diarrhea.  If you have many loose bowel movements, simplify your diet to bland foods & liquids for a few days.  Stop any stool softeners and decrease your fiber supplement.  Switching to mild anti-diarrheal medications (Kayopectate, Pepto Bismol) can help.  If this worsens or does not improve, please call us. Wash / shower every day.  You may shower over the dressings as they are waterproof.  Continue to shower over incision(s) after the dressing is off. Remove your waterproof bandages 5 days after surgery.  You may leave the incision open to air.  You may replace a dressing/Band-Aid to cover the incision for comfort if you wish.  ACTIVITIES as tolerated:   You may resume regular (light) daily activities beginning the next day--such as daily self-care, walking, climbing stairs--gradually increasing activities as tolerated.  If you can walk 30 minutes without difficulty, it is safe to try more intense activity such as jogging, treadmill, bicycling, low-impact aerobics, swimming, etc. Save the most intensive and strenuous activity for last such as sit-ups, heavy   lifting, contact sports, etc  Refrain from any heavy lifting or straining until you are off narcotics for pain control.   DO NOT PUSH THROUGH PAIN.  Let pain be your guide: If it hurts to do something, don't do it.  Pain is your body warning you to avoid that activity for another week until the pain goes down. You may drive when you are  no longer taking prescription pain medication, you can comfortably wear a seatbelt, and you can safely maneuver your car and apply brakes. You may have sexual intercourse when it is comfortable.  FOLLOW UP in our office Please call [facility] at [phone] to set up an appointment to see your surgeon in the office for a follow-up appointment approximately 2-3 weeks after your surgery. Make sure that you call for this appointment the day you arrive home to insure a convenient appointment time. 10. IF YOU HAVE DISABILITY OR FAMILY LEAVE FORMS, BRING THEM TO THE OFFICE FOR PROCESSING.  DO NOT GIVE THEM TO YOUR DOCTOR.   WHEN TO CALL US [phone]: Poor pain control Reactions / problems with new medications (rash/itching, nausea, etc)  Fever over 101.5 F (38.5 C) Inability to urinate Nausea and/or vomiting Worsening swelling or bruising Continued bleeding from incision. Increased pain, redness, or drainage from the incision   The clinic staff is available to answer your questions during regular business hours (8:30am-5pm).  Please don't hesitate to call and ask to speak to one of our nurses for clinical concerns.   If you have a medical emergency, go to the nearest emergency room or call 911.  A surgeon from [facility] is always on call at the hospitals   [facility] [address], [address], [place], [place]  [address] ? MAIN: [phone] ? TOLL FREE: [phone] ?  FAX [phone] [email or web address]   

## 2021-08-02 NOTE — Progress Notes (Signed)
Reviewed written e/c instructions w pt and all questions answered. He verbalized understanding. D/C via w/c w all belongings in stable condition. ?

## 2021-08-02 NOTE — Progress Notes (Signed)
Transition of Care (TOC) Screening Note ? ?Patient Details  ?Name: Mathew Olson ?Date of Birth: [birth year] ? ?Transition of Care (TOC) CM/SW Contact:    ?Ewing Schlein, LCSW ?Phone Number: ?08/02/2021, 8:58 AM ? ?Transition of Care Department [facility]) has reviewed patient and no TOC needs have been identified at this time. We will continue to monitor patient advancement through interdisciplinary progression rounds. If new patient transition needs arise, please place a TOC consult. ?

## 2021-08-03 LAB — SURGICAL PATHOLOGY

## 2024-03-16 IMAGING — CT CT ABD-PELV W/ CM
2 of 5 series · 15 of 46 positions shown, 17 images · IV contrast (Omnipaque)
Comparison: 09/26/2016

CLINICAL DATA: Epigastric and right upper quadrant abdominal pain

EXAM:
CT ABDOMEN AND PELVIS WITH CONTRAST
TECHNIQUE: Multidetector CT imaging of the abdomen and pelvis was performed
using the standard protocol following bolus administration of
intravenous contrast.

[Series 2: axial st · axial · 0.87mm/px · z∈[+1292,+1742]mm · 12 of 100 slices shown, 14 images]
[im 5/100  soft-tissue]
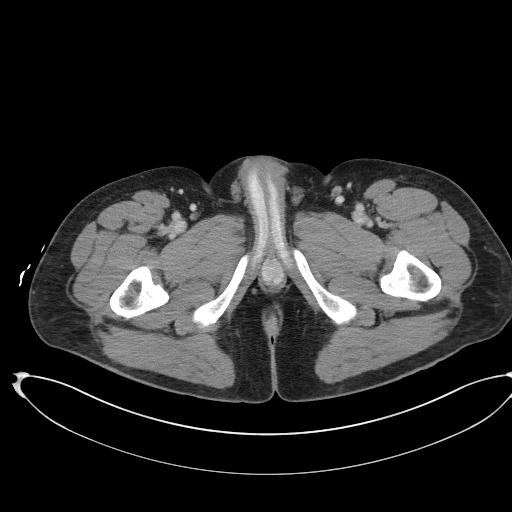
[im 5/100  bone]
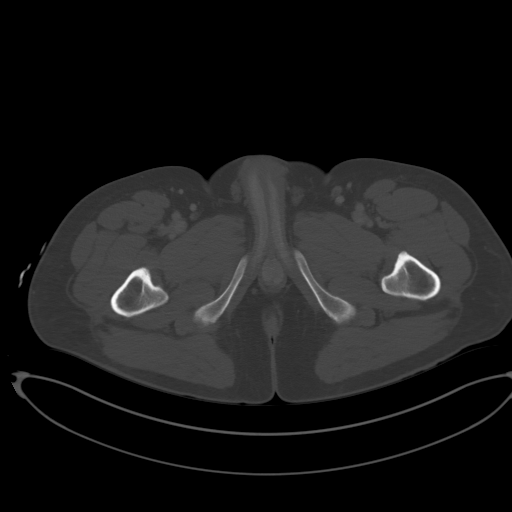
[im 15/100  soft-tissue]
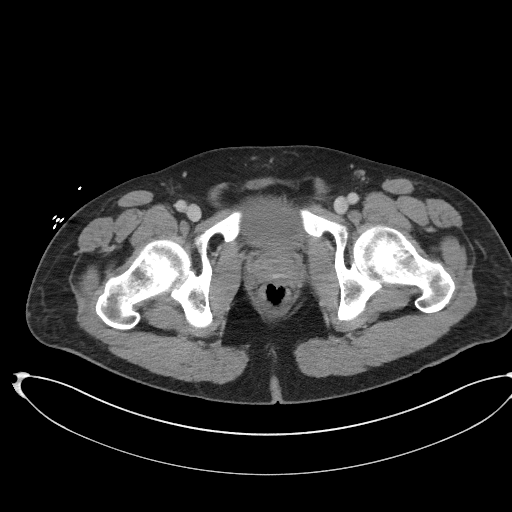
[im 20/100  soft-tissue]
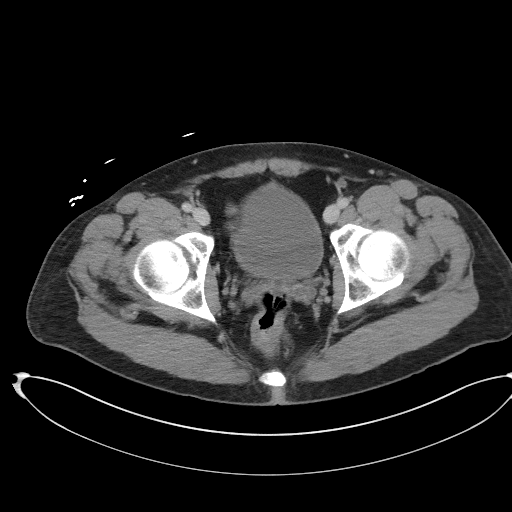
[im 30/100  soft-tissue]
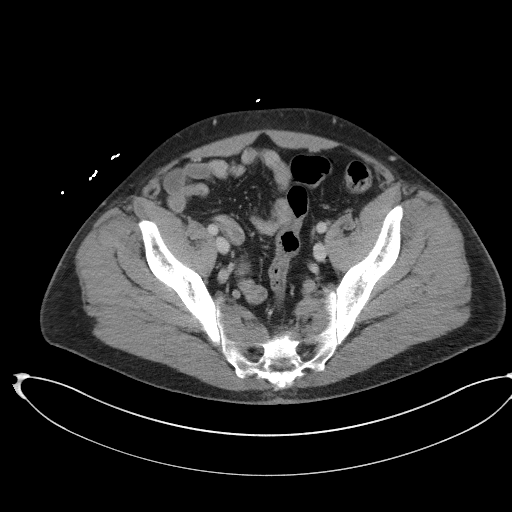
[im 40/100  soft-tissue]
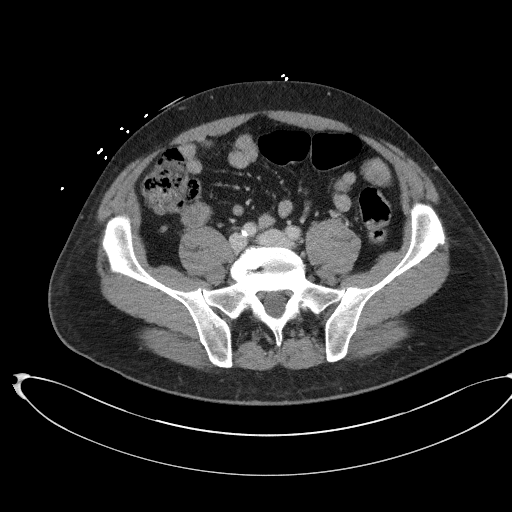
[im 45/100  soft-tissue]
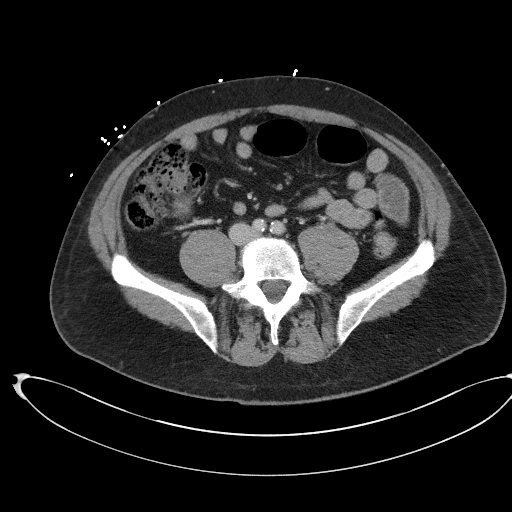
[im 55/100  soft-tissue]
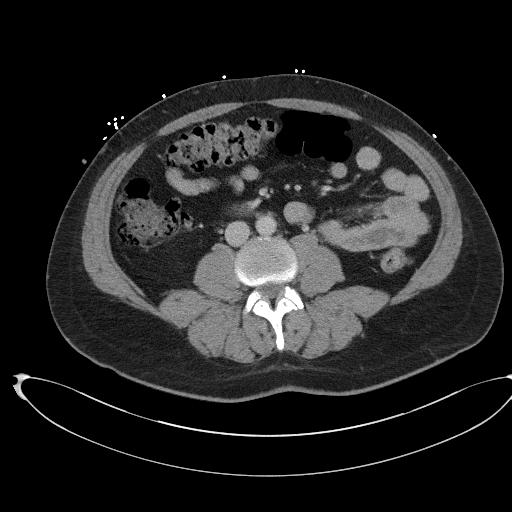
[im 60/100  soft-tissue]
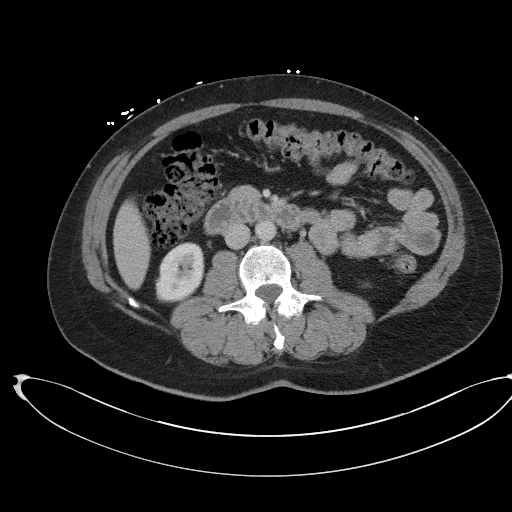
[im 70/100  soft-tissue]
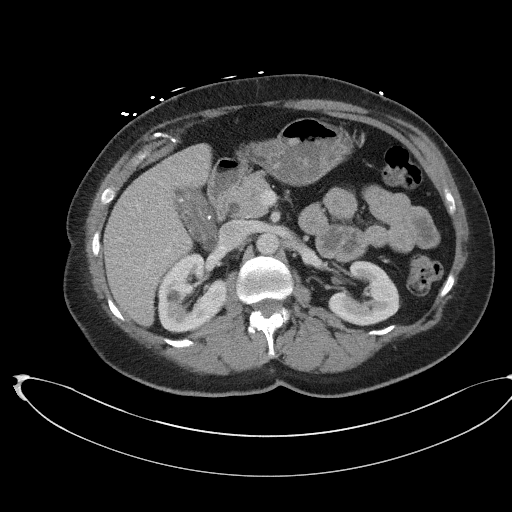
[im 70/100  bone]
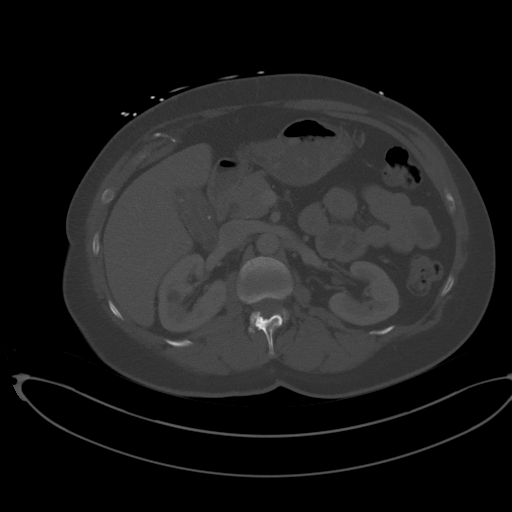
[im 80/100  soft-tissue]
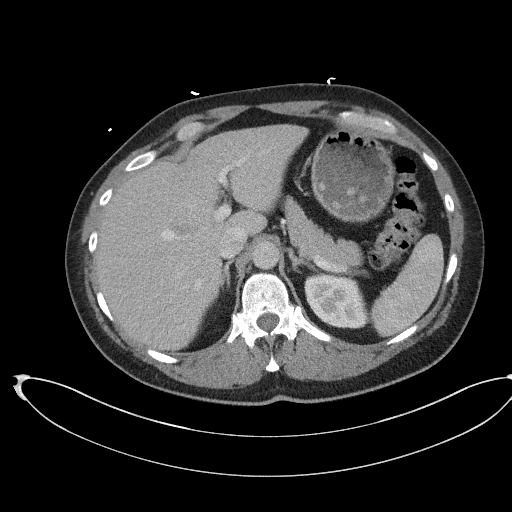
[im 85/100  soft-tissue]
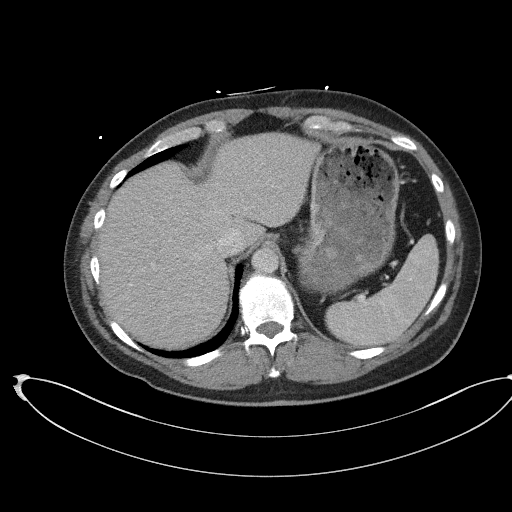
[im 95/100  soft-tissue]
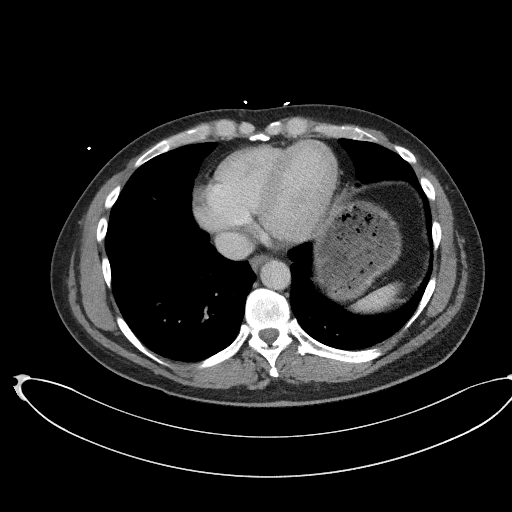

[Series 5: coronal st · coronal · 0.81mm/px · 3 of 103 slices shown]
[im 35/103  soft-tissue]
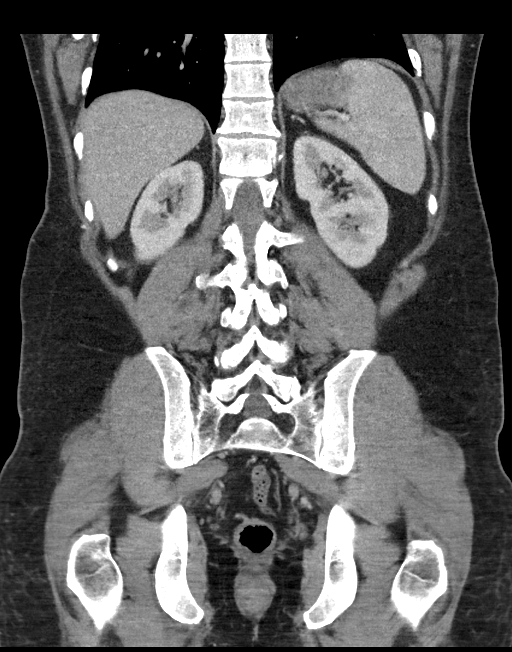
[im 46/103  soft-tissue]
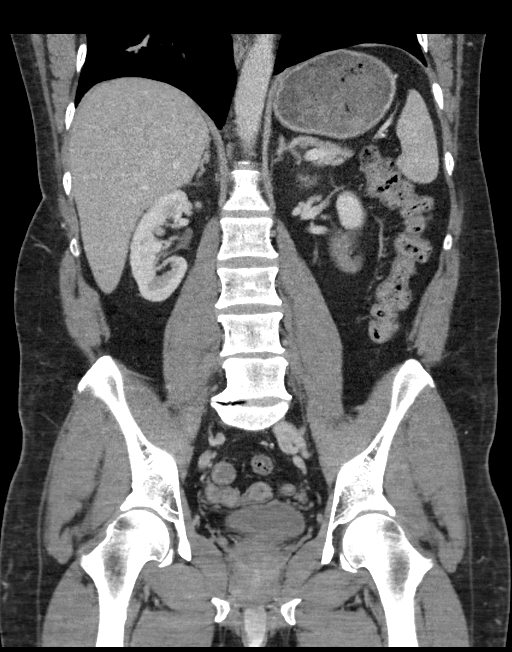
[im 57/103  soft-tissue]
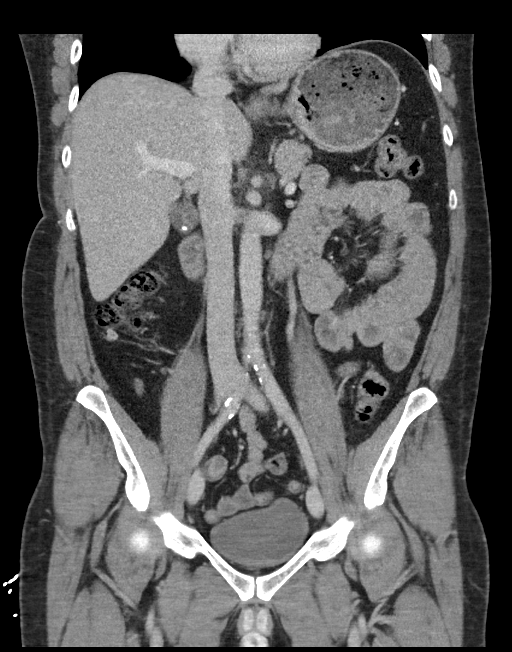

[15 of 46 positions shown; findings below may reference images not displayed]

RADIATION DOSE REDUCTION: This exam was performed according to the
departmental dose-optimization program which includes automated
exposure control, adjustment of the mA and/or kV according to
patient size and/or use of iterative reconstruction technique.

CONTRAST:  100mL OMNIPAQUE IOHEXOL 300 MG/ML  SOLN
FINDINGS: Lower chest: No acute abnormality.

Hepatobiliary: Cholelithiasis noted with layering small gallstones
within the gallbladder fundus identified. The gallbladder is not
distended, there is, however, irregular, diffuse gallbladder wall
thickening with scattered mural calcification. This may reflect
changes of chronic cholecystitis and appears similar to prior
examination. Mural calcification may reflect changes of a developing
porcelain gallbladder. A discrete mass lesion, however, is not
clearly identified. No enhancing intrahepatic mass. No intra or
extrahepatic biliary ductal dilation.

Pancreas: Unremarkable

Spleen: Unremarkable

Adrenals/Urinary Tract: Adrenal glands are unremarkable. Kidneys are
normal, without renal calculi, focal lesion, or hydronephrosis.
Bladder is unremarkable.

Stomach/Bowel: Stomach is within normal limits. Appendix appears
normal. No evidence of bowel wall thickening, distention, or
inflammatory changes. No free intraperitoneal gas or fluid.

Vascular/Lymphatic: Mild aortoiliac atherosclerotic calcification.
No aortic aneurysm. No pathologic adenopathy within the abdomen and
pelvis.

Reproductive: Prostate is unremarkable.

Other: No abdominal wall hernia.  Rectum unremarkable.

Musculoskeletal: No acute bone abnormality. No lytic or blastic bone
lesion.
IMPRESSION: Cholelithiasis. Diffuse gallbladder wall thickening with scattered
mural calcification. This appears similar to prior examination may
reflect changes of chronic cholecystitis. Mural calcification may
reflect a developing porcelain gallbladder. Surgical consultation
may be helpful for further management.

Aortic Atherosclerosis ([id number]).
